# Patient Record
Sex: Female | Born: 1950 | Race: White | Hispanic: No | Marital: Married | Smoking: Former smoker
Health system: Southern US, Community
[De-identification: ages and names within clinical notes are randomized; demographics above are authoritative.]

## PROBLEM LIST (undated history)

## (undated) DIAGNOSIS — I10 Essential (primary) hypertension: Secondary | ICD-10-CM

## (undated) DIAGNOSIS — H269 Unspecified cataract: Secondary | ICD-10-CM

## (undated) DIAGNOSIS — K219 Gastro-esophageal reflux disease without esophagitis: Secondary | ICD-10-CM

## (undated) HISTORY — DX: Unspecified cataract: H26.9

## (undated) HISTORY — PX: APPENDECTOMY: SHX54

## (undated) HISTORY — PX: TUBAL LIGATION: SHX77

## (undated) HISTORY — DX: Gastro-esophageal reflux disease without esophagitis: K21.9

## (undated) HISTORY — DX: Essential (primary) hypertension: I10

## (undated) HISTORY — PX: BREAST SURGERY: SHX581

---

## 2015-04-13 ENCOUNTER — Ambulatory Visit (INDEPENDENT_AMBULATORY_CARE_PROVIDER_SITE_OTHER): Payer: Medicare PPO | Admitting: Physician Assistant

## 2015-04-13 ENCOUNTER — Ambulatory Visit (INDEPENDENT_AMBULATORY_CARE_PROVIDER_SITE_OTHER): Payer: Medicare PPO

## 2015-04-13 VITALS — BP 140/82 | HR 68 | Temp 98.3°F | Resp 17 | Ht 61.0 in | Wt 187.2 lb

## 2015-04-13 DIAGNOSIS — R109 Unspecified abdominal pain: Secondary | ICD-10-CM

## 2015-04-13 DIAGNOSIS — R112 Nausea with vomiting, unspecified: Secondary | ICD-10-CM

## 2015-04-13 DIAGNOSIS — R319 Hematuria, unspecified: Secondary | ICD-10-CM | POA: Diagnosis not present

## 2015-04-13 LAB — POCT URINALYSIS DIP (MANUAL ENTRY)
Bilirubin, UA: NEGATIVE
Glucose, UA: NEGATIVE
Leukocytes, UA: NEGATIVE
NITRITE UA: NEGATIVE
SPEC GRAV UA: 1.02
UROBILINOGEN UA: 0.2
pH, UA: 5.5

## 2015-04-13 LAB — POC MICROSCOPIC URINALYSIS (UMFC): MUCUS RE: ABSENT

## 2015-04-13 NOTE — Progress Notes (Signed)
Subjective:   Patient ID: Taylor Lynn, female     DOB: 09-Oct-1950, 64 y.o.    MRN: [id number]  PCP: Pcp Not In System  Chief Complaint  Patient presents with  . Emesis    started last night  . Abdominal Pain    HPI  Presents for evaluation of abdominal pain and nausea/vomiting. She is here visiting family for the holiday and is accompanied by her daughter, Ashok CordiaMaria Johnson.  Came on suddenly last night while playing with her grandchildren. She developed pain in the RIGHT flank and RIGHT low abdomen. Then developed nausea and vomiting. Once she vomited, the nausea resolved, but the pain didn't resolve until about 5 am. She was then able to sleep, and awoke about 9 am. She sipped on ginger ale and then tried a cup of coffee. Drinking the coffee was associated with recurrence of the pain, nausea and vomiting. Again, the nausea resolved after emesis, and then the pain resolved. She hasn't taken in any other liquids today.   No diarrhea.  Maybe some constipation. No fever, chills. No cough, sore throat, congestion. No recent illness or unusual activities. No known sick contacts. No unusual foods.    Prior to Admission medications   Medication Sig Start Date End Date Taking? Authorizing Provider  Acetaminophen (TYLENOL PO) Take by mouth.   Yes Historical Provider, MD  lisinopril (PRINIVIL,ZESTRIL) 10 MG tablet TK 1 T PO  QD 03/15/15  Yes Historical Provider, MD  pravastatin (PRAVACHOL) 20 MG tablet TK 1 T PO QD HS 04/05/15  Yes Historical Provider, MD     Allergies  Allergen Reactions  . Sulfa Antibiotics      There are no active problems to display for this patient.    No family history on file.   Social History   Social History  . Marital Status: Married    Spouse Name: N/A  . Number of Children: N/A  . Years of Education: N/A   Occupational History  . Not on file.   Social History Main Topics  . Smoking status: Former Games developermoker  . Smokeless tobacco: Not on  file  . Alcohol Use: Not on file  . Drug Use: Not on file  . Sexual Activity: Not on file   Other Topics Concern  . Not on file   Social History Narrative  . No narrative on file        Review of Systems  Constitutional: Positive for fatigue. Negative for fever and chills.  HENT: Negative for congestion, rhinorrhea, sinus pressure and sore throat.   Eyes: Negative for visual disturbance.  Respiratory: Negative for cough, choking, chest tightness and shortness of breath.   Cardiovascular: Negative for chest pain and palpitations.  Gastrointestinal: Positive for nausea, vomiting and abdominal pain. Negative for diarrhea, constipation and blood in stool.  Genitourinary: Positive for flank pain. Negative for dysuria, urgency, frequency, hematuria and difficulty urinating.  Musculoskeletal: Negative for back pain.  Neurological: Negative for dizziness, weakness, light-headedness and headaches.         Objective:  Physical Exam  Constitutional: She is oriented to person, place, and time. Vital signs are normal. She appears well-developed and well-nourished. She is active and cooperative. No distress.  BP 140/82 mmHg  Pulse 68  Temp(Src) 98.3 F (36.8 C) (Oral)  Resp 17  Ht 5\' 1"  (1.549 m)  Wt 187 lb 3.2 oz (84.913 kg)  BMI 35.39 kg/m2  SpO2 96%  HENT:  Head: Normocephalic and atraumatic.  Right Ear: Hearing  normal.  Left Ear: Hearing normal.  Eyes: Conjunctivae are normal. No scleral icterus.  Neck: Normal range of motion. Neck supple. No thyromegaly present.  Cardiovascular: Normal rate, regular rhythm and normal heart sounds.   Pulses:      Radial pulses are 2+ on the right side, and 2+ on the left side.  Pulmonary/Chest: Effort normal and breath sounds normal.  Abdominal: Soft. Normal appearance and bowel sounds are normal. There is no hepatosplenomegaly. There is tenderness. There is no CVA tenderness.    Abdominal pain is worst in the RIGHT mid-abdomen, but also  tender in the suprapubic area.  Lymphadenopathy:       Head (right side): No tonsillar, no preauricular, no posterior auricular and no occipital adenopathy present.       Head (left side): No tonsillar, no preauricular, no posterior auricular and no occipital adenopathy present.    She has no cervical adenopathy.       Right: No supraclavicular adenopathy present.       Left: No supraclavicular adenopathy present.  Neurological: She is alert and oriented to person, place, and time. No sensory deficit.  Skin: Skin is warm, dry and intact. No rash noted. No cyanosis or erythema. Nails show no clubbing.  Psychiatric: She has a normal mood and affect. Her speech is normal and behavior is normal.   Acute Abdominal Series: APP read. Awaiting radiology read.   Results for orders placed or performed in visit on 04/13/15  POCT urinalysis dipstick  Result Value Ref Range   Color, UA orange (A) yellow   Clarity, UA cloudy (A) clear   Glucose, UA negative negative   Bilirubin, UA negative negative   Ketones, POC UA trace (5) (A) negative   Spec Grav, UA 1.020    Blood, UA large (A) negative   pH, UA 5.5    Protein Ur, POC =100 (A) negative   Urobilinogen, UA 0.2    Nitrite, UA Negative Negative   Leukocytes, UA Negative Negative  POCT Microscopic Urinalysis ([facility])  Result Value Ref Range   WBC,UR,HPF,POC Few (A) None WBC/hpf   RBC,UR,HPF,POC Too numerous to count  (A) None RBC/hpf   Bacteria None None, Too numerous to count   Mucus Absent Absent   Epithelial Cells, UR Per Microscopy Few (A) None, Too numerous to count cells/hpf          Assessment & Plan:  1. Flank pain 2. Nausea and vomiting, intractability of vomiting not specified, unspecified vomiting type 3. Hematuria Suspect nephrolithiasis. Anticipatory guidance provided. Supportive care. Follow-up in 1-2 weeks with PCP to reassess hematuria, sooner if symptoms worsen. - POCT urinalysis dipstick - POCT Microscopic  Urinalysis ([facility]) - DG Abd Acute W/Chest    Fernande Bras, PA-C Physician Assistant-Certified [facility]

## 2015-04-13 NOTE — Patient Instructions (Addendum)
I suspect that you have passed a kidney stone. If your symptoms persist, you'll need additional evaluation (a CT scan and possibly a specialist referral to urology). Because of the blood in your urine, you need to follow-up with your primary care provider in 1-2 weeks.

## 2015-04-14 ENCOUNTER — Telehealth: Payer: Self-pay | Admitting: Physician Assistant

## 2015-04-14 NOTE — Telephone Encounter (Signed)
Left message for daughter, Byrd HesselbachMaria.

## 2016-06-12 IMAGING — CR DG ABDOMEN ACUTE W/ 1V CHEST
4 series · 4 of 4 positions shown · non-contrast
Comparison: None.

CLINICAL DATA: Abdominal pain

EXAM:
DG ABDOMEN ACUTE W/ 1V CHEST

[PA]
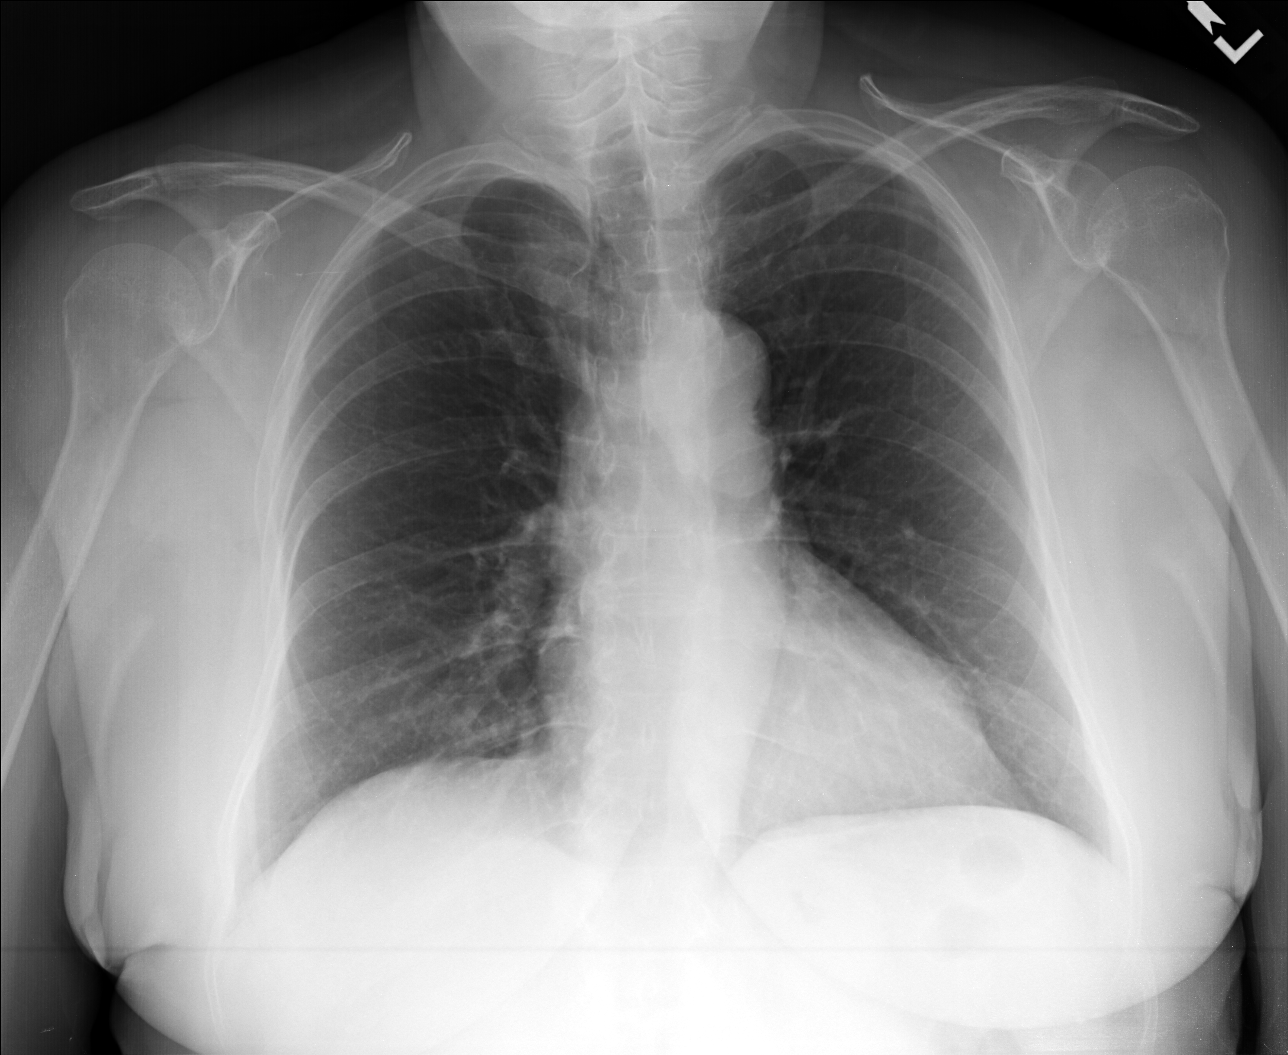

[AP (1 of 3)]
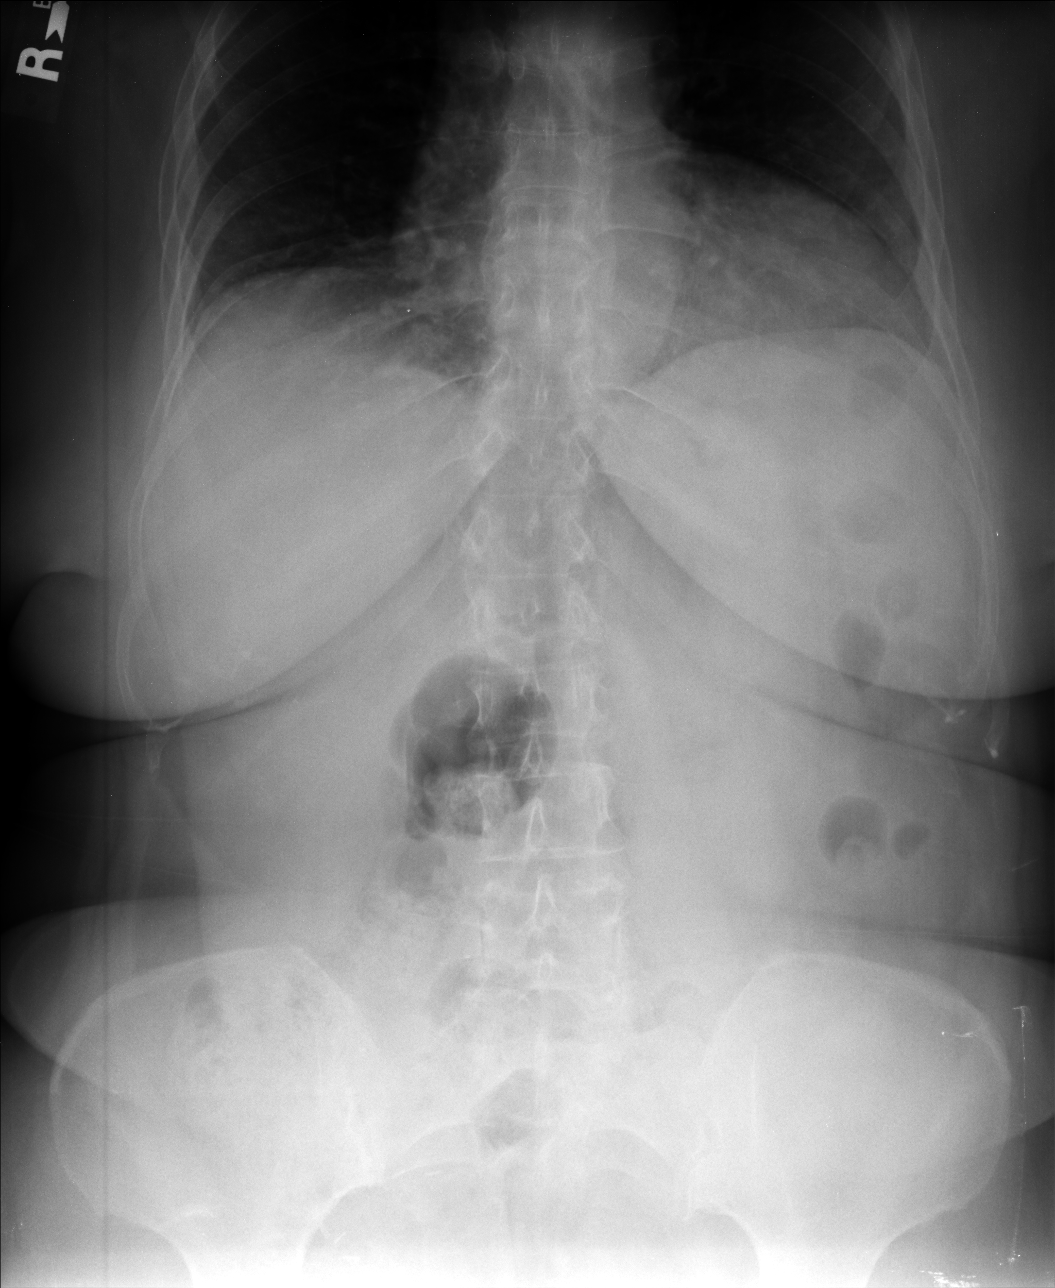

[AP (2 of 3)]
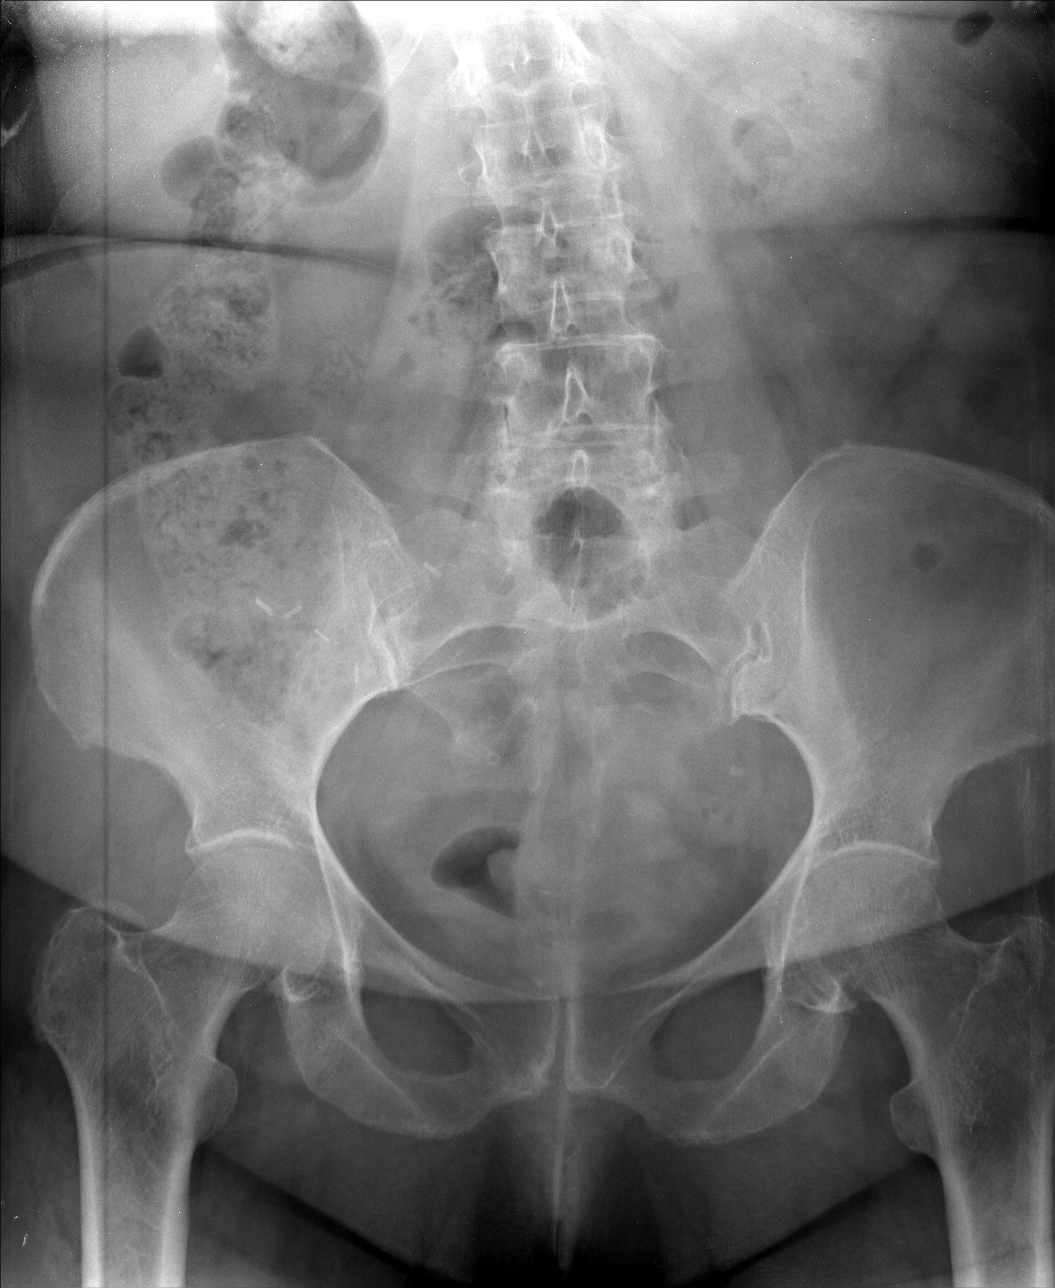

[AP (3 of 3)]
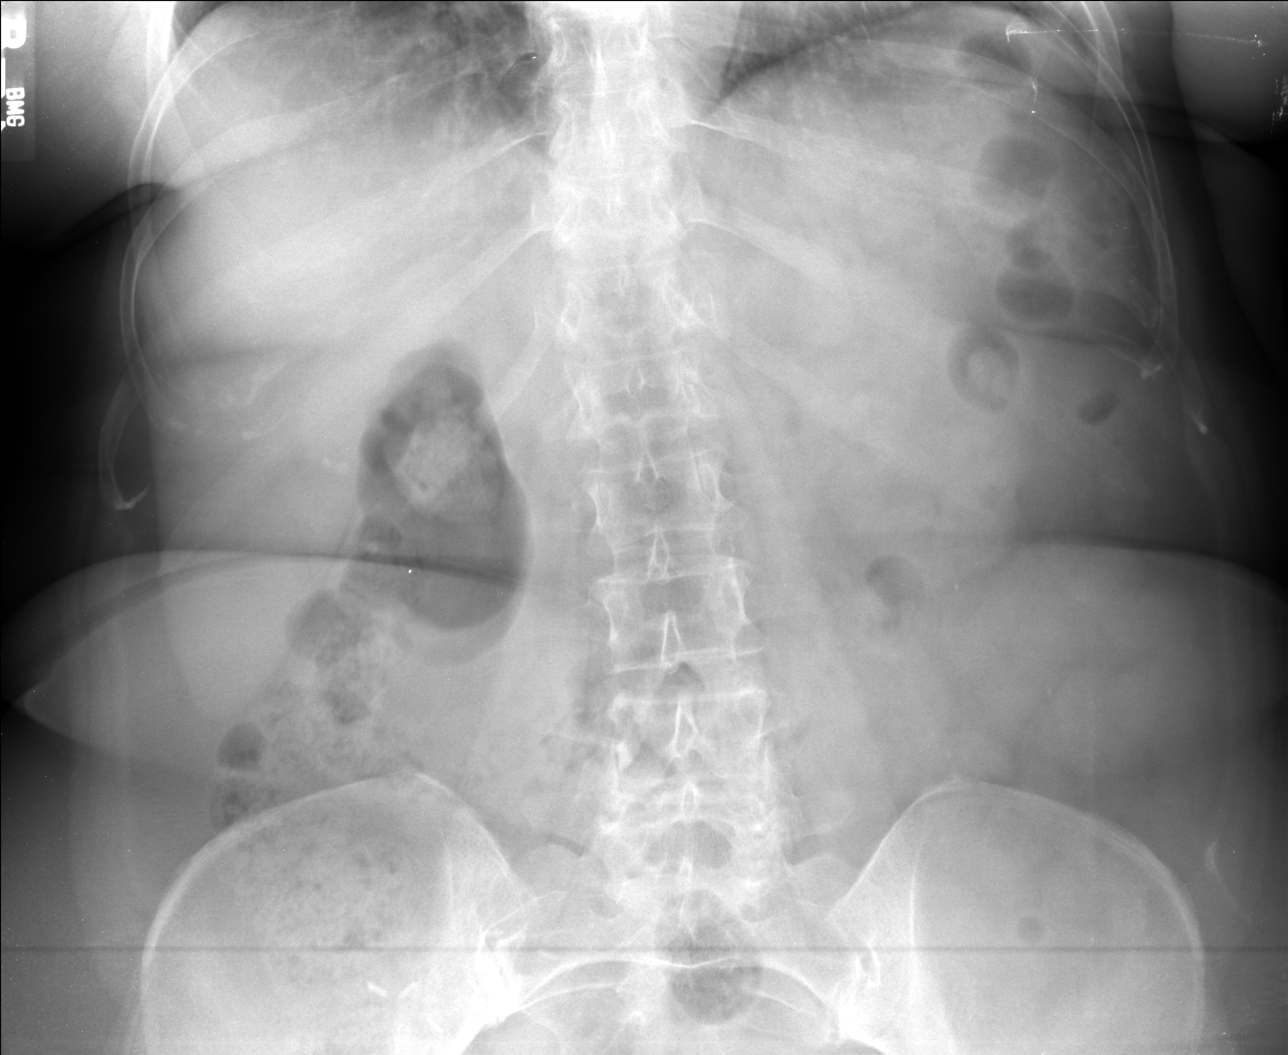

[4 of 4 positions shown; findings below may reference images not displayed]

FINDINGS: Normal cardiac silhouette. Lungs are clear. No free air beneath
hemidiaphragms.

No dilated loops of large or small bowel. There is stool in the
RIGHT colon. Gas within the rectum. No pathologic calcifications.
Surgical clips in the RIGHT lower quadrant. No acute osseous
abnormality.
IMPRESSION: 1.  No acute cardiopulmonary process.
2. No bowel obstruction or free air.
